# Patient Record
Sex: Male | Born: 1985 | Race: White | Hispanic: No | Marital: Single
Health system: Southern US, Community
[De-identification: ages and names within clinical notes are randomized; demographics above are authoritative.]

---
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
## 2007-03-17 ENCOUNTER — Emergency Department: Payer: Self-pay | Admitting: Emergency Medicine

## 2007-03-26 ENCOUNTER — Ambulatory Visit: Payer: Self-pay | Admitting: Internal Medicine

## 2008-03-31 IMAGING — CR ORBITS FOR FOREIGN BODY - 2 VIEW
1 series · 3 of 3 positions shown · non-contrast
Comparison: none

REASON FOR EXAM: To eval for metallic foreign body
COMMENTS:

[Series 1: view not recorded · 0.17mm/px · 3 of 3 slices shown]
[im 1/3]
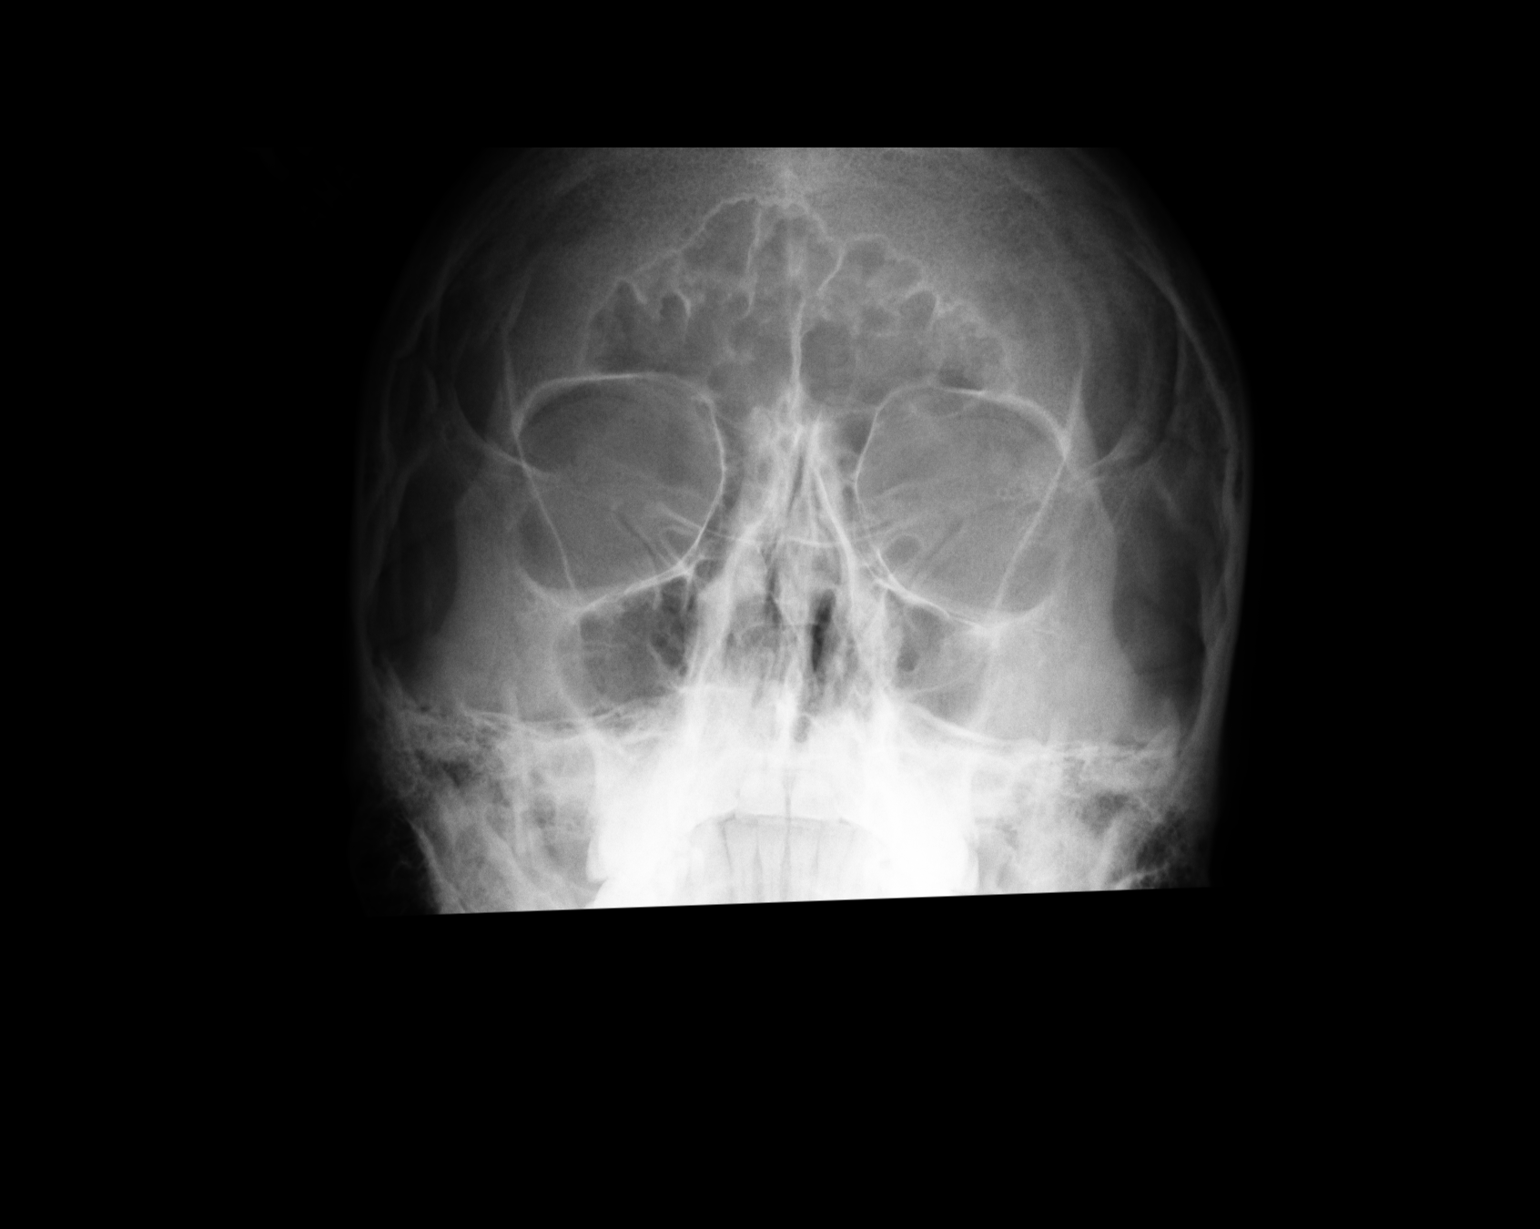
[im 2/3]
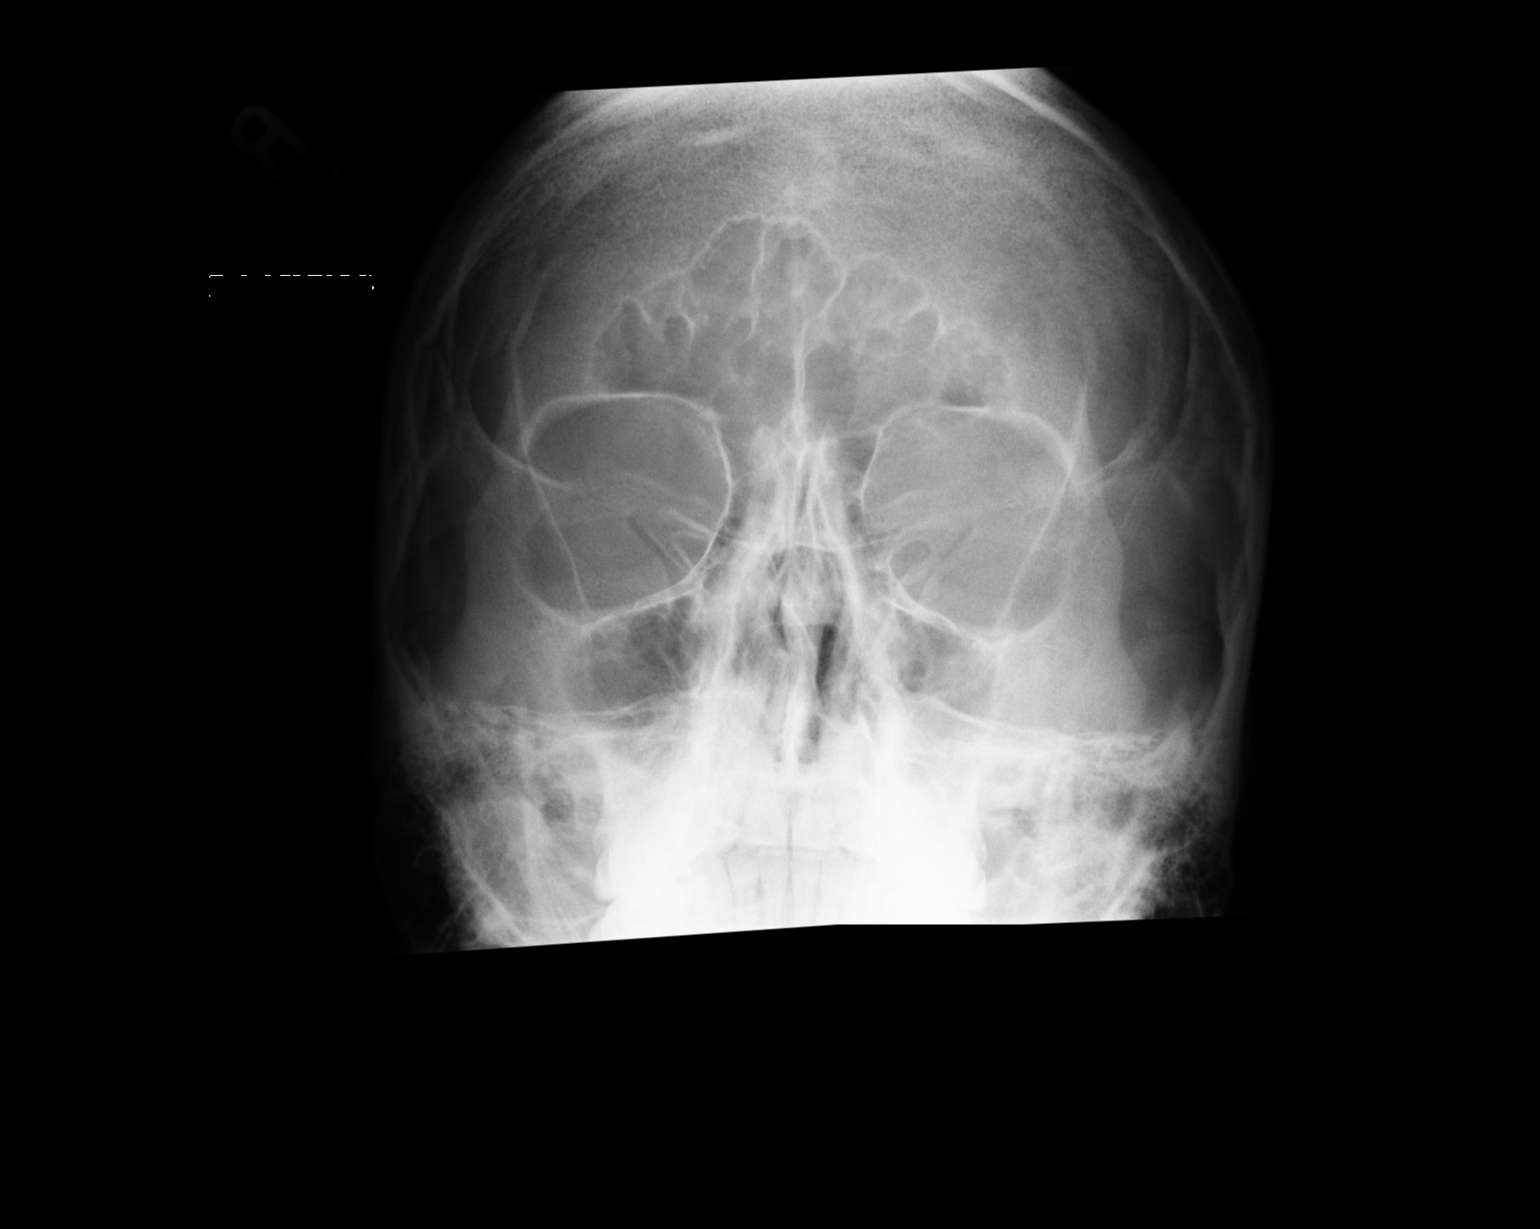
[im 3/3]
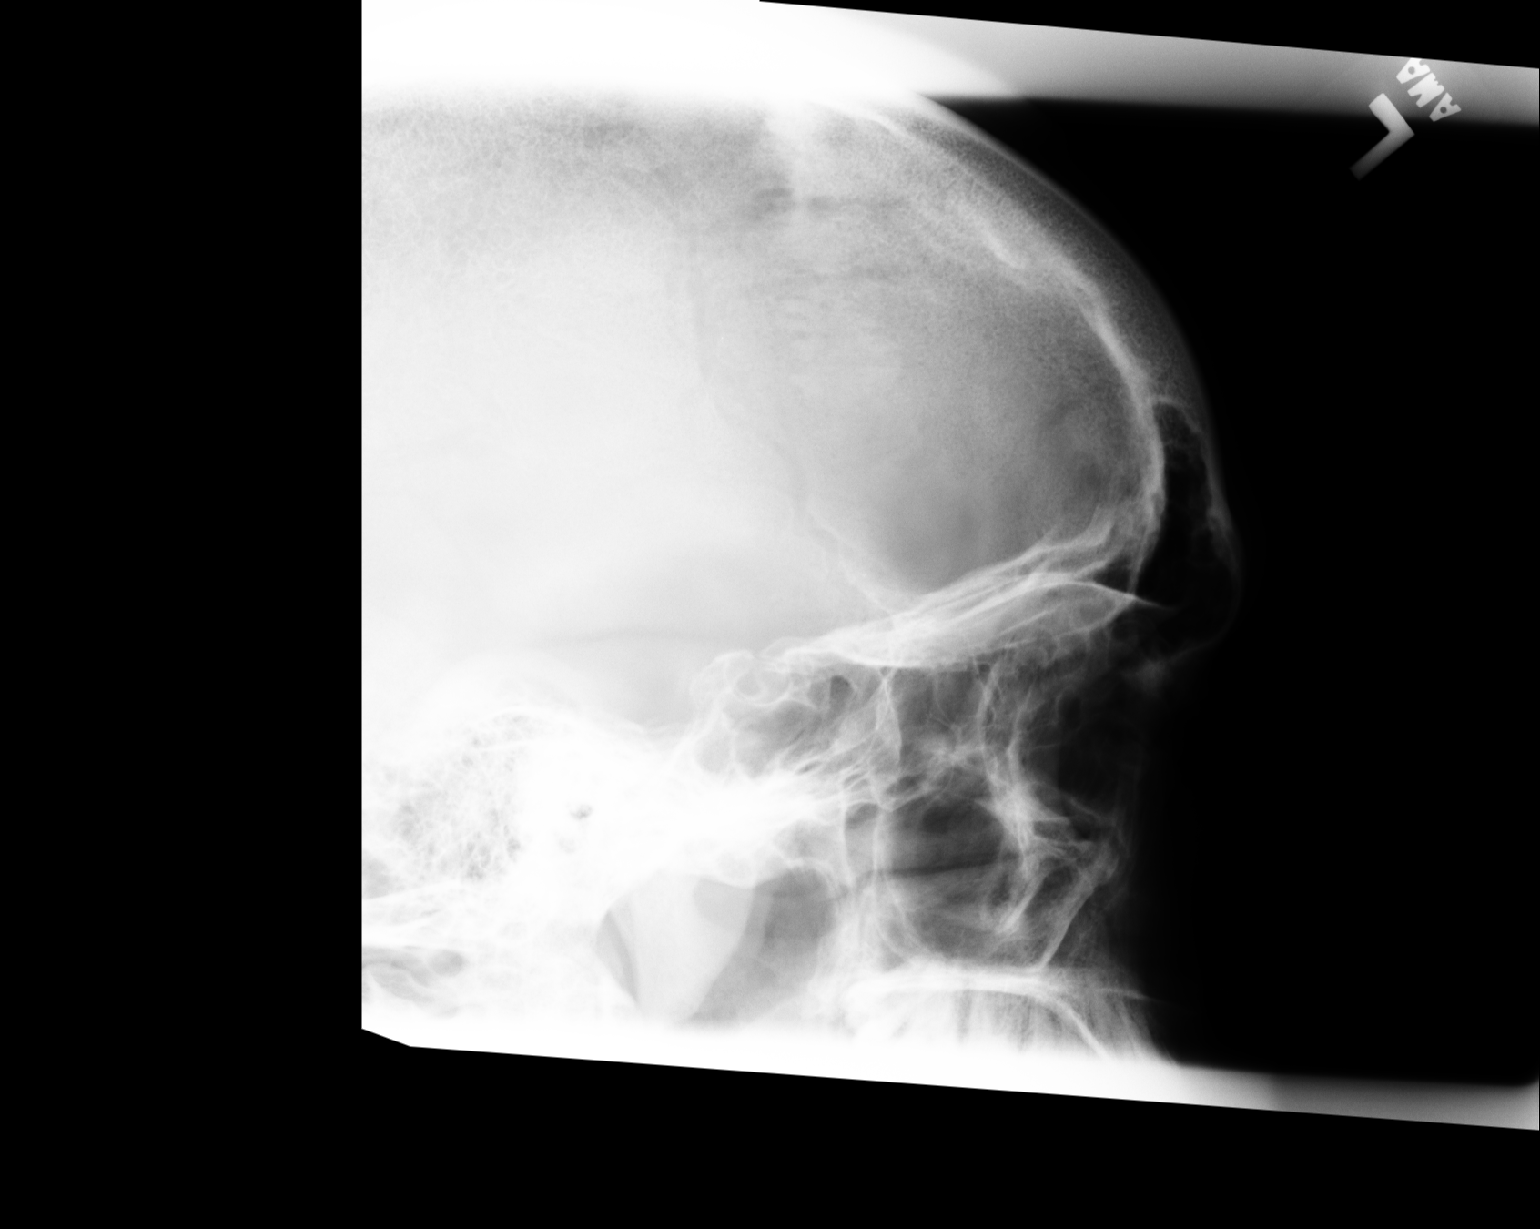

[3 of 3 positions shown; findings below may reference images not displayed]

PROCEDURE:     DXR - DXR ORBITS FOR MRI CLEARANCE  - March 26, 2007  [DATE]

RESULT:     Images of the orbits were obtained prior to MRI given the
patient's stated history of a possible retained metallic density. No
radiopacity is evident to suggest a metallic fragment. The orbits are clear.
The sinuses are clear.
IMPRESSION: No retained metallic foreign body demonstrated.

## 2019-09-27 ENCOUNTER — Ambulatory Visit: Payer: Self-pay | Attending: Internal Medicine

## 2019-09-27 DIAGNOSIS — Z23 Encounter for immunization: Secondary | ICD-10-CM

## 2019-09-27 NOTE — Progress Notes (Signed)
   [facility]  Name:  [place]    MRN: [id number] DOB: 26-Aug-1985  09/27/2019  Mr. Magos was observed post Covid-19 immunization for 15 minutes without incident. He was provided with Vaccine Information Sheet and instruction to access the V-Safe system.   Mr. Diguglielmo was instructed to call 911 with any severe reactions post vaccine: Marland Kitchen Difficulty breathing  . Swelling of face and throat  . A fast heartbeat  . A bad rash all over body  . Dizziness and weakness   Immunizations Administered    Name Date Dose VIS Date Route   Moderna COVID-19 Vaccine 09/27/2019 12:52 PM 0.5 mL 01/2019 Intramuscular   Manufacturer: Moderna   Lot: [id number]   NDC: 28118-867-73
